# Patient Record
Sex: Male | Born: 1975 | Race: Black or African American | Hispanic: No | Marital: Married | Smoking: Never smoker
Health system: Southern US, Community
[De-identification: ages and names within clinical notes are randomized; demographics above are authoritative.]

---

## 1999-10-03 ENCOUNTER — Emergency Department (HOSPITAL_COMMUNITY): Admission: EM | Admit: 1999-10-03 | Discharge: 1999-10-03 | Payer: Self-pay | Admitting: Emergency Medicine

## 2007-12-19 ENCOUNTER — Emergency Department (HOSPITAL_COMMUNITY): Admission: EM | Admit: 2007-12-19 | Discharge: 2007-12-19 | Payer: Self-pay | Admitting: Emergency Medicine

## 2007-12-26 ENCOUNTER — Emergency Department (HOSPITAL_COMMUNITY): Admission: EM | Admit: 2007-12-26 | Discharge: 2007-12-26 | Payer: Self-pay | Admitting: Emergency Medicine
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
## 2010-04-29 ENCOUNTER — Emergency Department (HOSPITAL_COMMUNITY)
Admission: EM | Admit: 2010-04-29 | Discharge: 2010-04-29 | Payer: Self-pay | Source: Home / Self Care | Admitting: Emergency Medicine

## 2010-05-04 ENCOUNTER — Emergency Department (HOSPITAL_COMMUNITY)
Admission: EM | Admit: 2010-05-04 | Discharge: 2010-05-04 | Payer: Self-pay | Source: Home / Self Care | Admitting: Emergency Medicine

## 2010-08-05 LAB — CBC
HCT: 42.6 % (ref 39.0–52.0)
Hemoglobin: 14.5 g/dL (ref 13.0–17.0)
MCH: 30 pg (ref 26.0–34.0)
MCHC: 34 g/dL (ref 30.0–36.0)
MCV: 88 fL (ref 78.0–100.0)
Platelets: 157 10*3/uL (ref 150–400)
RBC: 4.84 MIL/uL (ref 4.22–5.81)
RDW: 13.1 % (ref 11.5–15.5)
WBC: 4.7 10*3/uL (ref 4.0–10.5)

## 2010-08-05 LAB — DIFFERENTIAL
Basophils Absolute: 0 10*3/uL (ref 0.0–0.1)
Basophils Relative: 1 % (ref 0–1)
Eosinophils Absolute: 0.1 10*3/uL (ref 0.0–0.7)
Eosinophils Relative: 2 % (ref 0–5)
Lymphocytes Relative: 41 % (ref 12–46)
Lymphs Abs: 1.9 10*3/uL (ref 0.7–4.0)
Monocytes Absolute: 0.5 10*3/uL (ref 0.1–1.0)
Monocytes Relative: 12 % (ref 3–12)
Neutro Abs: 2.1 10*3/uL (ref 1.7–7.7)
Neutrophils Relative %: 44 % (ref 43–77)

## 2010-08-05 LAB — COMPREHENSIVE METABOLIC PANEL
ALT: 18 U/L (ref 0–53)
AST: 30 U/L (ref 0–37)
Albumin: 3.6 g/dL (ref 3.5–5.2)
Alkaline Phosphatase: 74 U/L (ref 39–117)
BUN: 13 mg/dL (ref 6–23)
CO2: 29 mEq/L (ref 19–32)
Calcium: 9.1 mg/dL (ref 8.4–10.5)
Chloride: 106 mEq/L (ref 96–112)
Creatinine, Ser: 1.73 mg/dL — ABNORMAL HIGH (ref 0.4–1.5)
GFR calc Af Amer: 55 mL/min — ABNORMAL LOW (ref >60)
GFR calc non Af Amer: 46 mL/min — ABNORMAL LOW (ref >60)
Glucose, Bld: 96 mg/dL (ref 70–99)
Potassium: 4.1 mEq/L (ref 3.5–5.1)
Sodium: 141 mEq/L (ref 135–145)
Total Bilirubin: 0.5 mg/dL (ref 0.3–1.2)
Total Protein: 6.8 g/dL (ref 6.0–8.3)

## 2011-02-20 LAB — POCT URINALYSIS DIP (DEVICE)
Bilirubin Urine: NEGATIVE
Glucose, UA: NEGATIVE
Ketones, ur: NEGATIVE
Nitrite: NEGATIVE
Operator id: 239701
Protein, ur: 30 — AB
Specific Gravity, Urine: 1.025
Urobilinogen, UA: 1
pH: 6

## 2014-08-15 ENCOUNTER — Encounter (HOSPITAL_COMMUNITY): Payer: Self-pay | Admitting: Emergency Medicine

## 2014-08-15 ENCOUNTER — Emergency Department (HOSPITAL_COMMUNITY)
Admission: EM | Admit: 2014-08-15 | Discharge: 2014-08-16 | Disposition: A | Discharge status: Home / Self Care | Payer: Self-pay | Attending: Emergency Medicine | Admitting: Emergency Medicine

## 2014-08-15 ENCOUNTER — Emergency Department (HOSPITAL_COMMUNITY): Payer: Self-pay

## 2014-08-15 DIAGNOSIS — Z7982 Long term (current) use of aspirin: Secondary | ICD-10-CM | POA: Insufficient documentation

## 2014-08-15 DIAGNOSIS — R509 Fever, unspecified: Secondary | ICD-10-CM

## 2014-08-15 DIAGNOSIS — R7989 Other specified abnormal findings of blood chemistry: Secondary | ICD-10-CM | POA: Insufficient documentation

## 2014-08-15 DIAGNOSIS — B349 Viral infection, unspecified: Secondary | ICD-10-CM | POA: Insufficient documentation

## 2014-08-15 LAB — I-STAT CHEM 8, ED
BUN: 16 mg/dL (ref 6–23)
Calcium, Ion: 1.13 mmol/L (ref 1.12–1.23)
Chloride: 95 mmol/L — ABNORMAL LOW (ref 96–112)
Creatinine, Ser: 1.8 mg/dL — ABNORMAL HIGH (ref 0.50–1.35)
Glucose, Bld: 154 mg/dL — ABNORMAL HIGH (ref 70–99)
HCT: 50 % (ref 39.0–52.0)
Hemoglobin: 17 g/dL (ref 13.0–17.0)
Potassium: 4.2 mmol/L (ref 3.5–5.1)
Sodium: 136 mmol/L (ref 135–145)
TCO2: 25 mmol/L (ref 0–100)

## 2014-08-15 LAB — CBC WITH DIFFERENTIAL/PLATELET
Basophils Absolute: 0 10*3/uL (ref 0.0–0.1)
Basophils Relative: 1 % (ref 0–1)
Eosinophils Absolute: 0 10*3/uL (ref 0.0–0.7)
Eosinophils Relative: 0 % (ref 0–5)
HCT: 44.2 % (ref 39.0–52.0)
Hemoglobin: 14.8 g/dL (ref 13.0–17.0)
Lymphocytes Relative: 18 % (ref 12–46)
Lymphs Abs: 1.2 10*3/uL (ref 0.7–4.0)
MCH: 28.6 pg (ref 26.0–34.0)
MCHC: 33.5 g/dL (ref 30.0–36.0)
MCV: 85.5 fL (ref 78.0–100.0)
Monocytes Absolute: 0.8 10*3/uL (ref 0.1–1.0)
Monocytes Relative: 12 % (ref 3–12)
Neutro Abs: 4.5 10*3/uL (ref 1.7–7.7)
Neutrophils Relative %: 70 % (ref 43–77)
Platelets: 135 10*3/uL — ABNORMAL LOW (ref 150–400)
RBC: 5.17 MIL/uL (ref 4.22–5.81)
RDW: 13.4 % (ref 11.5–15.5)
WBC: 6.5 10*3/uL (ref 4.0–10.5)

## 2014-08-15 MED ORDER — PROMETHAZINE HCL 25 MG PO TABS: 25.0000 mg | ORAL_TABLET | Freq: Four times a day (QID) | ORAL | Status: DC | PRN

## 2014-08-15 MED ORDER — KETOROLAC TROMETHAMINE 30 MG/ML IJ SOLN
30.0000 mg | Freq: Once | INTRAMUSCULAR | Status: AC
  Administered 2014-08-15: 30 mg via INTRAVENOUS
  Filled 2014-08-15: qty 1

## 2014-08-15 MED ORDER — ACETAMINOPHEN 325 MG PO TABS
650.0000 mg | ORAL_TABLET | Freq: Once | ORAL | Status: AC
  Administered 2014-08-15: 650 mg via ORAL

## 2014-08-15 MED ORDER — DIPHENHYDRAMINE HCL 50 MG/ML IJ SOLN
25.0000 mg | Freq: Once | INTRAMUSCULAR | Status: AC
  Administered 2014-08-15: 25 mg via INTRAVENOUS
  Filled 2014-08-15: qty 1

## 2014-08-15 MED ORDER — SODIUM CHLORIDE 0.9 % IV BOLUS (SEPSIS)
1000.0000 mL | Freq: Once | INTRAVENOUS | Status: AC
  Administered 2014-08-15: 1000 mL via INTRAVENOUS

## 2014-08-15 MED ORDER — NAPROXEN 500 MG PO TABS: 500.0000 mg | ORAL_TABLET | Freq: Two times a day (BID) | ORAL | Status: DC

## 2014-08-15 MED ORDER — ACETAMINOPHEN 325 MG PO TABS
ORAL_TABLET | ORAL | Status: AC
  Filled 2014-08-15: qty 2

## 2014-08-15 MED ORDER — METOCLOPRAMIDE HCL 5 MG/ML IJ SOLN
10.0000 mg | Freq: Once | INTRAMUSCULAR | Status: AC
  Administered 2014-08-15: 10 mg via INTRAVENOUS
  Filled 2014-08-15: qty 2

## 2014-08-15 NOTE — ED Notes (Addendum)
Pt st's he started having body aches on Sun.  C/O pain in all of his joints.  Nausea without vomiting.  Pt also c/o headache.

## 2014-08-15 NOTE — Discharge Instructions (Signed)
Take Naprosyn as needed for fever and muscle pain. Take phenergan as needed for nausea. Refer to attached documents for more information.

## 2014-08-15 NOTE — ED Provider Notes (Signed)
CSN: [id number]     Arrival date & time 08/15/14  1755 History   First MD Initiated Contact with Patient 08/15/14 2012     Chief Complaint  Patient presents with  . Generalized Body Aches     (Consider location/radiation/quality/duration/timing/severity/associated sxs/prior Treatment) HPI Comments: Patient is a 39 year old male with no past medical history who presents with body aches for the past 3 days. Symptoms started gradually and progressively worsened since the onset. The body aches are generalized. He did not try anything at home for symptoms. He reports associated subjective fever, headache, and nausea without vomiting. No aggravating/alleviating factors. No known sick contacts.    History reviewed. No pertinent past medical history. History reviewed. No pertinent past surgical history. No family history on file. History  Substance Use Topics  . Smoking status: Never Smoker   . Smokeless tobacco: Not on file  . Alcohol Use: No    Review of Systems  Constitutional: Positive for fever. Negative for chills and fatigue.  HENT: Negative for trouble swallowing.   Eyes: Negative for visual disturbance.  Respiratory: Negative for shortness of breath.   Cardiovascular: Negative for chest pain and palpitations.  Gastrointestinal: Negative for nausea, vomiting, abdominal pain and diarrhea.  Genitourinary: Negative for dysuria and difficulty urinating.  Musculoskeletal: Positive for myalgias. Negative for arthralgias and neck pain.  Skin: Negative for color change.  Neurological: Negative for dizziness and weakness.  Psychiatric/Behavioral: Negative for dysphoric mood.      Allergies  Review of patient's allergies indicates no known allergies.  Home Medications   Prior to Admission medications   Medication Sig Start Date End Date Taking? Authorizing Provider  aspirin EC 81 MG tablet Take 81 mg by mouth daily as needed for mild pain or moderate pain.   Yes Historical  Provider, MD  Chlorphen-Pseudoephed-APAP (THERAFLU FLU/COLD PO) Take 1 packet by mouth every 4 (four) hours as needed (cold/flu like symptoms).   Yes Historical Provider, MD  ibuprofen (ADVIL,MOTRIN) 200 MG tablet Take 600 mg by mouth daily as needed for mild pain or moderate pain.   Yes Historical Provider, MD  AMOXICILLIN PO Take 1 tablet by mouth once.    Historical Provider, MD  OVER THE COUNTER MEDICATION Place 2 sprays into both nostrils once. Over the counter nasal spray    Historical Provider, MD   BP 137/82 mmHg  Pulse 78  Temp(Src) 99.5 F (37.5 C) (Oral)  Resp 16  SpO2 97% Physical Exam  Constitutional: He is oriented to person, place, and time. He appears well-developed and well-nourished. No distress.  HENT:  Head: Normocephalic and atraumatic.  Eyes: Conjunctivae and EOM are normal.  Neck: Normal range of motion.  Cardiovascular: Normal rate and regular rhythm.  Exam reveals no gallop and no friction rub.   No murmur heard. Pulmonary/Chest: Effort normal and breath sounds normal. He has no wheezes. He has no rales. He exhibits no tenderness.  Abdominal: Soft. He exhibits no distension. There is no tenderness. There is no rebound.  Musculoskeletal: Normal range of motion.  Neurological: He is alert and oriented to person, place, and time. Coordination normal.  No meningeal signs. Speech is goal-oriented. Moves limbs without ataxia.   Skin: Skin is warm and dry.  Psychiatric: He has a normal mood and affect. His behavior is normal.  Nursing note and vitals reviewed.   ED Course  Procedures (including critical care time) Labs Review Labs Reviewed  CBC WITH DIFFERENTIAL/PLATELET - Abnormal; Notable for the following:  Platelets 135 (*)    All other components within normal limits  I-STAT CHEM 8, ED - Abnormal; Notable for the following:    Chloride 95 (*)    Creatinine, Ser 1.80 (*)    Glucose, Bld 154 (*)    All other components within normal limits    Imaging  Review Dg Chest 2 View  08/15/2014   CLINICAL DATA:  Fever, nausea, body aches  EXAM: CHEST  2 VIEW  COMPARISON:  12/19/2007  FINDINGS: Cardiomediastinal silhouette is stable. No acute infiltrate or pleural effusion. No pulmonary edema. Bony thorax is unremarkable.  IMPRESSION: No active cardiopulmonary disease.   Electronically Signed   By: Natasha MeadLiviu  Pop M.D.   On: 08/15/2014 21:28     EKG Interpretation None      MDM   Final diagnoses:  Fever  Viral illness    8:44 PM Labs show mild elevated in creatinine with other values stable. Patient is febrile with remaining vitals stable.   11:58 PM Patient feeling better and will be discharged. Patient likely has a viral illness and will be discharged with naprosyn and phenergan.   Emilia BeckKaitlyn Cartier Washko, PA-C 08/15/14 2359  Arby BarretteMarcy Pfeiffer, MD 08/16/14 [phone]

## 2014-08-15 NOTE — ED Notes (Signed)
Gave pt water, per RN 

## 2016-03-12 IMAGING — CR DG CHEST 2V
2 series · 2 of 2 positions shown · non-contrast
Comparison: 12/19/2007

CLINICAL DATA: Fever, nausea, body aches

EXAM:
CHEST  2 VIEW

[w chest pa]
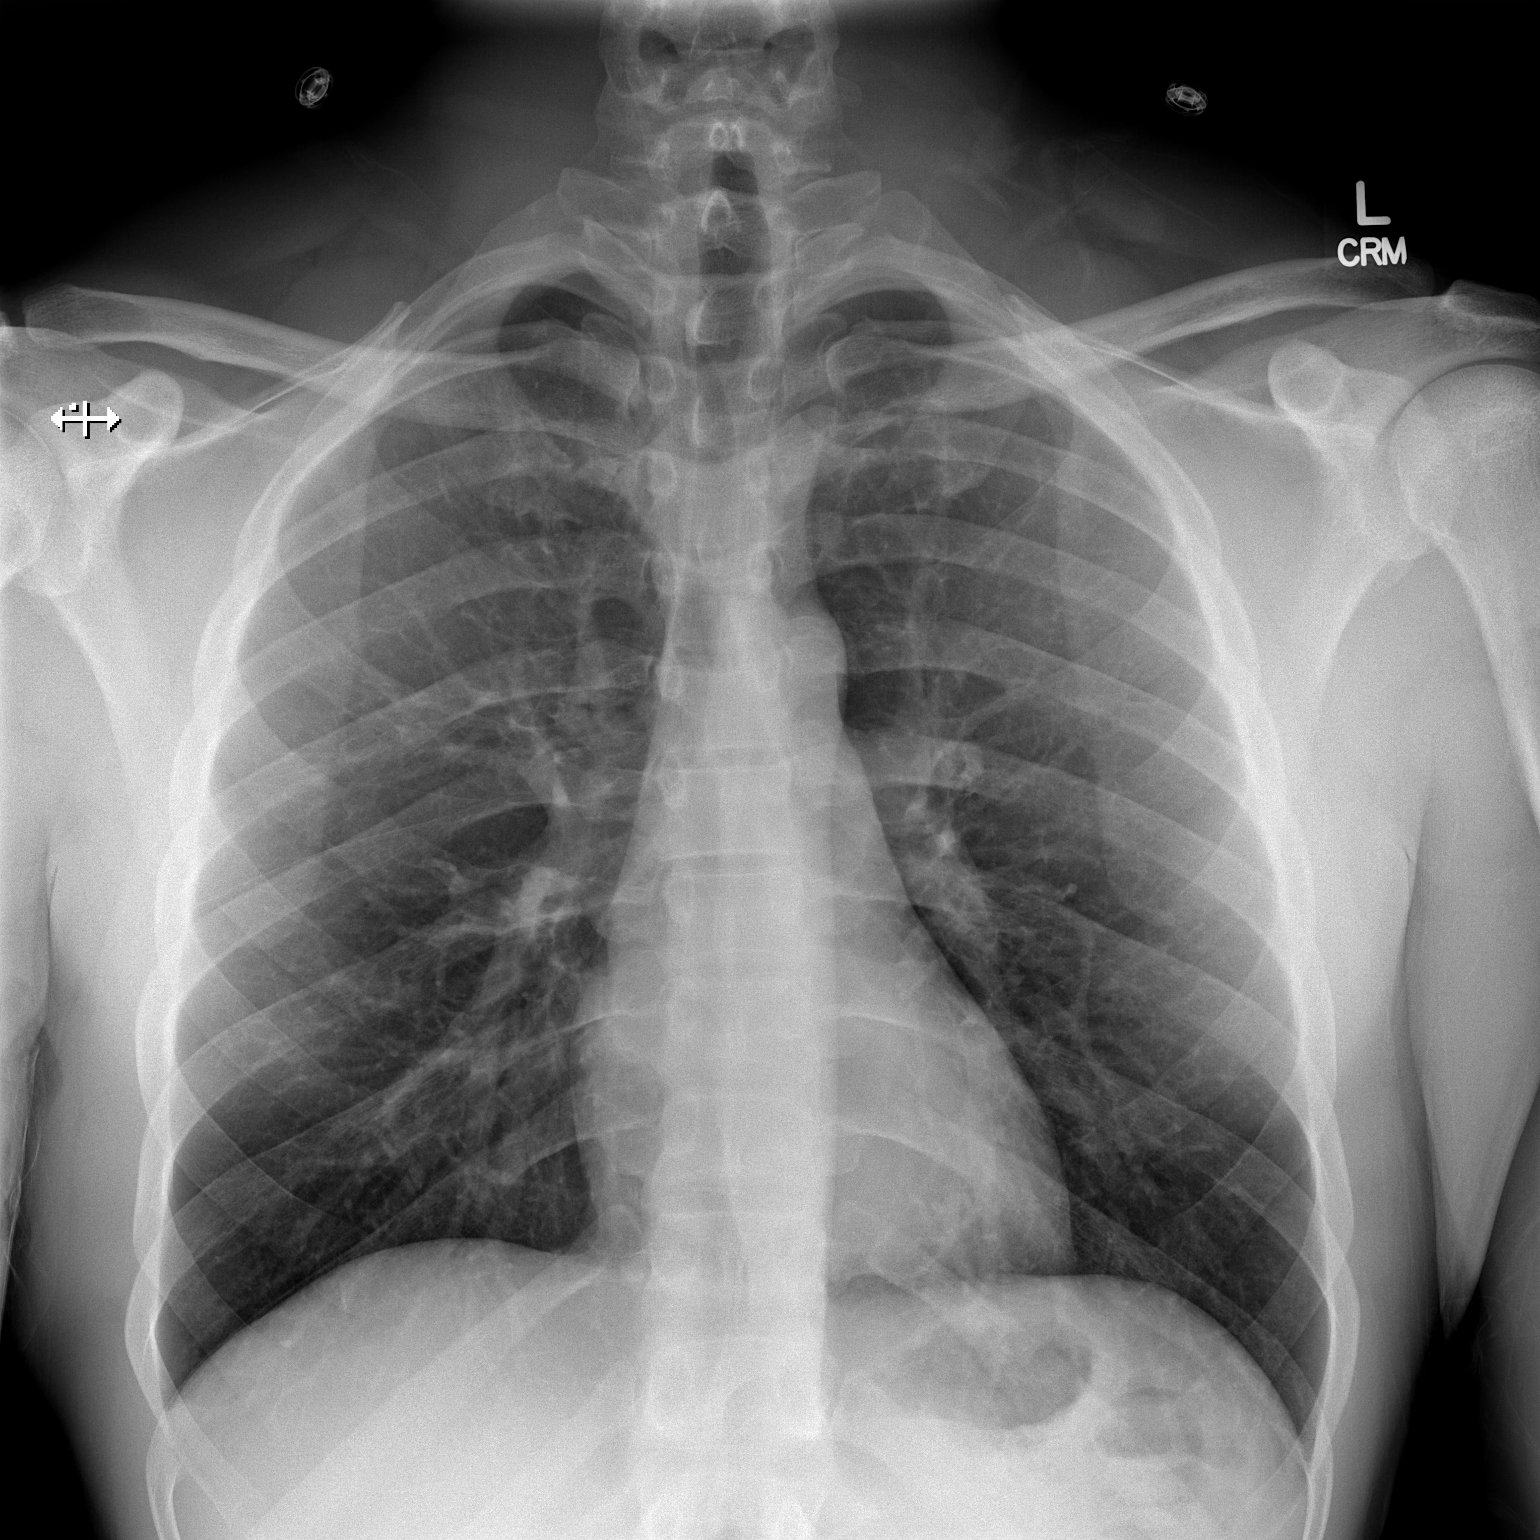

[w chest lat]
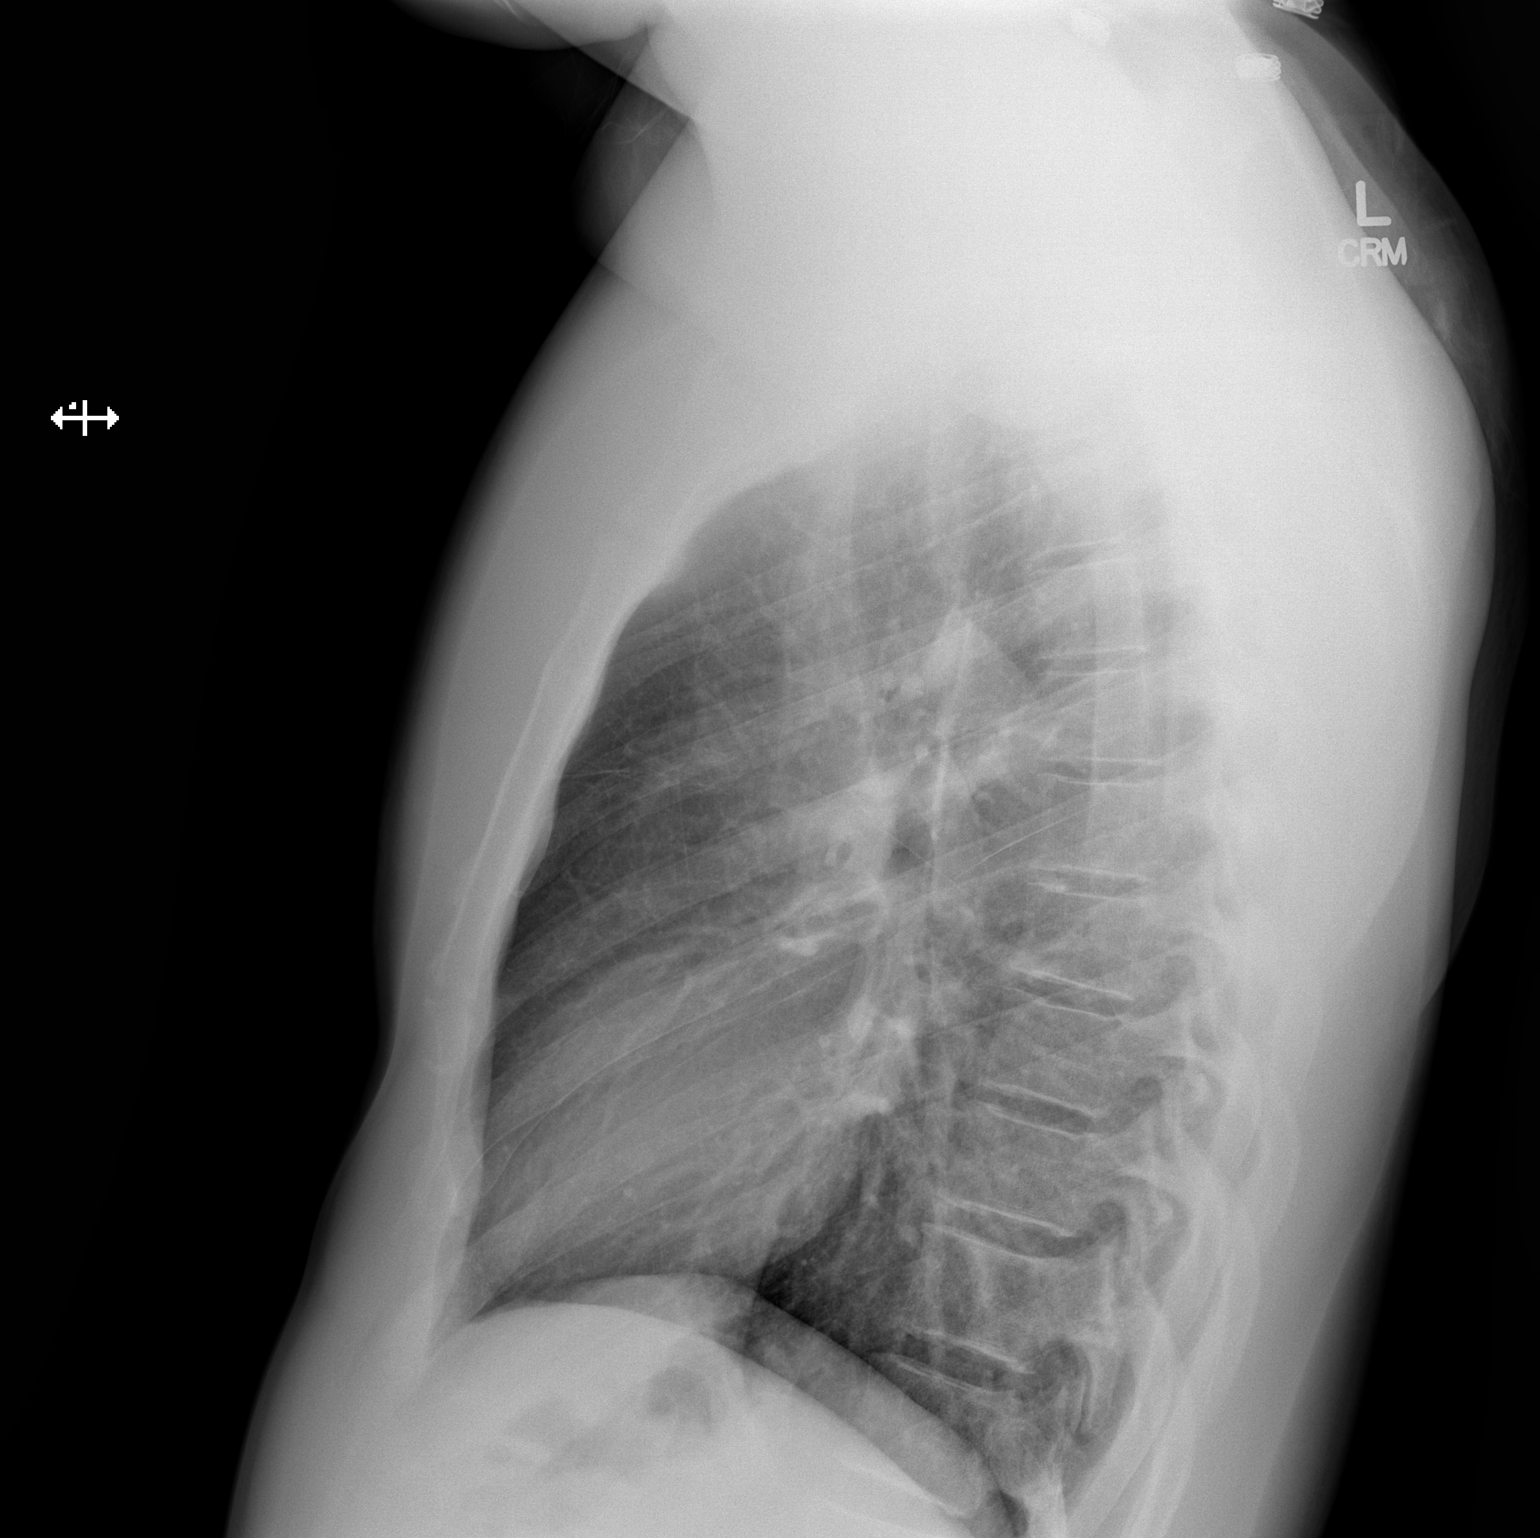

[2 of 2 positions shown; findings below may reference images not displayed]

FINDINGS: Cardiomediastinal silhouette is stable. No acute infiltrate or
pleural effusion. No pulmonary edema. Bony thorax is unremarkable.
IMPRESSION: No active cardiopulmonary disease.

## 2018-01-29 ENCOUNTER — Other Ambulatory Visit: Payer: Self-pay | Admitting: Nephrology

## 2018-01-29 DIAGNOSIS — N183 Chronic kidney disease, stage 3 unspecified: Secondary | ICD-10-CM

## 2018-03-08 ENCOUNTER — Ambulatory Visit
Admission: RE | Admit: 2018-03-08 | Discharge: 2018-03-08 | Disposition: A | Discharge status: Home / Self Care | Payer: Self-pay | Source: Ambulatory Visit | Attending: Nephrology | Admitting: Nephrology

## 2018-03-08 DIAGNOSIS — N183 Chronic kidney disease, stage 3 unspecified: Secondary | ICD-10-CM

## 2018-08-10 ENCOUNTER — Encounter (HOSPITAL_COMMUNITY): Payer: Self-pay

## 2018-08-10 ENCOUNTER — Emergency Department (HOSPITAL_COMMUNITY)
Admission: EM | Admit: 2018-08-10 | Discharge: 2018-08-10 | Disposition: A | Discharge status: Home / Self Care | Payer: BLUE CROSS/BLUE SHIELD | Attending: Emergency Medicine | Admitting: Emergency Medicine

## 2018-08-10 ENCOUNTER — Other Ambulatory Visit: Payer: Self-pay

## 2018-08-10 DIAGNOSIS — Y939 Activity, unspecified: Secondary | ICD-10-CM | POA: Insufficient documentation

## 2018-08-10 DIAGNOSIS — Z7982 Long term (current) use of aspirin: Secondary | ICD-10-CM | POA: Insufficient documentation

## 2018-08-10 DIAGNOSIS — Z23 Encounter for immunization: Secondary | ICD-10-CM | POA: Insufficient documentation

## 2018-08-10 DIAGNOSIS — Y92017 Garden or yard in single-family (private) house as the place of occurrence of the external cause: Secondary | ICD-10-CM | POA: Insufficient documentation

## 2018-08-10 DIAGNOSIS — S81812A Laceration without foreign body, left lower leg, initial encounter: Secondary | ICD-10-CM

## 2018-08-10 DIAGNOSIS — Y999 Unspecified external cause status: Secondary | ICD-10-CM | POA: Insufficient documentation

## 2018-08-10 DIAGNOSIS — W312XXA Contact with powered woodworking and forming machines, initial encounter: Secondary | ICD-10-CM | POA: Insufficient documentation

## 2018-08-10 MED ORDER — TRAMADOL HCL 50 MG PO TABS: 50.0000 mg | ORAL_TABLET | Freq: Four times a day (QID) | ORAL | 0 refills | Status: DC | PRN

## 2018-08-10 MED ORDER — LIDOCAINE-EPINEPHRINE (PF) 2 %-1:200000 IJ SOLN
20.0000 mL | Freq: Once | INTRAMUSCULAR | Status: AC
  Administered 2018-08-10: 20 mL via INTRADERMAL
  Filled 2018-08-10: qty 20

## 2018-08-10 MED ORDER — TETANUS-DIPHTH-ACELL PERTUSSIS 5-2.5-18.5 LF-MCG/0.5 IM SUSP
0.5000 mL | Freq: Once | INTRAMUSCULAR | Status: AC
  Administered 2018-08-10: 0.5 mL via INTRAMUSCULAR
  Filled 2018-08-10: qty 0.5

## 2018-08-10 NOTE — ED Provider Notes (Signed)
[facility] EMERGENCY DEPARTMENT Provider Note   CSN: [id number] Arrival date & time: 08/10/18  1711    History   Chief Complaint Chief Complaint  Patient presents with  . Laceration    HPI Matthew Tran is a 43 y.o. male who presents to the ED for laceration to the left lower extremity.  Patient was working in his yard today with a chain saw when he accidentally hit the back of his leg.  He sustained a laceration to the left lower extremity.  He washed it and immediately wrapped it and came to the emergency department he is not up-to-date on his tetanus vaccination.  He denies numbness or tingling or inability to use the foot to walk.     HPI  History reviewed. No pertinent past medical history.  There are no active problems to display for this patient.   History reviewed. No pertinent surgical history.      Home Medications    Prior to Admission medications   Medication Sig Start Date End Date Taking? Authorizing Provider  aspirin EC 81 MG tablet Take 81 mg by mouth daily as needed for mild pain or moderate pain.    [provider]  Chlorphen-Pseudoephed-APAP (THERAFLU FLU/COLD PO) Take 1 packet by mouth every 4 (four) hours as needed (cold/flu like symptoms).    [provider]  ibuprofen (ADVIL,MOTRIN) 200 MG tablet Take 600 mg by mouth daily as needed for mild pain or moderate pain.    [provider]  naproxen (NAPROSYN) 500 MG tablet Take 1 tablet (500 mg total) by mouth 2 (two) times daily with a meal. 08/15/14   Szekalski, Kaitlyn, PA-C  OVER THE COUNTER MEDICATION Place 2 sprays into both nostrils once. Over the counter nasal spray    [provider]  promethazine (PHENERGAN) 25 MG tablet Take 1 tablet (25 mg total) by mouth every 6 (six) hours as needed for nausea or vomiting. 08/15/14   Emilia Beck, PA-C    Family History History reviewed. No pertinent family history.  Social History Social  History   Tobacco Use  . Smoking status: Never Smoker  Substance Use Topics  . Alcohol use: No  . Drug use: No     Allergies   Patient has no known allergies.   Review of Systems Review of Systems Ten systems reviewed and are negative for acute change, except as noted in the HPI.   Physical Exam Updated Vital Signs BP 133/88 (BP Location: Left Arm)   Pulse 72   Temp 97.9 F (36.6 C) (Oral)   Resp 18   Ht  (1.93 m)   Wt 102.1 kg   SpO2 100%   BMI 27.39 kg/m   Physical Exam Vitals signs and nursing note reviewed.  Constitutional:      General: He is not in acute distress.    Appearance: He is well-developed. He is not diaphoretic.  HENT:     Head: Normocephalic and atraumatic.  Eyes:     General: No scleral icterus.    Conjunctiva/sclera: Conjunctivae normal.  Neck:     Musculoskeletal: Normal range of motion and neck supple.  Cardiovascular:     Rate and Rhythm: Normal rate and regular rhythm.     Heart sounds: Normal heart sounds.  Pulmonary:     Effort: Pulmonary effort is normal. No respiratory distress.     Breath sounds: Normal breath sounds.  Abdominal:     Palpations: Abdomen is soft.  Tenderness: There is no abdominal tenderness.  Musculoskeletal:     Comments: 10 cm laceration to the left calf.  Skin:    General: Skin is warm and dry.  Neurological:     Mental Status: He is alert.  Psychiatric:        Behavior: Behavior normal.     Physical Exam  Nursing note and vitals reviewed. Constitutional: He appears well-developed and well-nourished. No distress.  HENT:  Head: Normocephalic and atraumatic.  Eyes: Conjunctivae normal are normal. No scleral icterus.  Neck: Normal range of motion. Neck supple.  Cardiovascular: Normal rate, regular rhythm and normal heart sounds.   Pulmonary/Chest: Effort normal and breath sounds normal. No respiratory distress.  Abdominal: Soft. There is no tenderness.  Musculoskeletal: He exhibits no edema.   Neurological: He is alert.  Skin: Skin is warm and dry. He is not diaphoretic.  Psychiatric: His behavior is normal.    ED Treatments / Results  Labs (all labs ordered are listed, but only abnormal results are displayed) Labs Reviewed - No data to display  EKG None  Radiology No results found.  Procedures .Marland KitchenLaceration Repair Date/Time: 08/10/2018 6:46 PM Performed by: Arthor Captain, PA-C Authorized by: Arthor Captain, PA-C   Consent:    Consent obtained:  Verbal   Consent given by:  Patient   Risks discussed:  Infection, pain and need for additional repair   Alternatives discussed:  No treatment and delayed treatment Anesthesia (see MAR for exact dosages):    Anesthesia method:  Local infiltration   Local anesthetic:  Lidocaine 2% WITH epi (12) Laceration details:    Location:  Leg   Leg location:  L lower leg   Length (cm):  10   Depth (mm):  10 Repair type:    Repair type:  Intermediate Pre-procedure details:    Preparation:  Patient was prepped and draped in usual sterile fashion Exploration:    Hemostasis achieved with:  Epinephrine   Wound exploration: wound explored through full range of motion     Contaminated: yes   Treatment:    Area cleansed with:  Saline   Amount of cleaning:  Standard   Irrigation solution:  Sterile water   Irrigation method:  Pressure wash Fascia repair:    Suture size:  4-0   Suture material:  Vicryl   Suture technique:  Running   Number of sutures:  8 Skin repair:    Repair method:  Sutures   Suture size:  4-0   Suture material:  Prolene   Suture technique:  Vertical mattress and running locked   Number of sutures:  11 Approximation:    Approximation:  Close Post-procedure details:    Dressing:  Non-adherent dressing   Patient tolerance of procedure:  Tolerated well, no immediate complications   (including critical care time)  Medications Ordered in ED Medications  lidocaine-EPINEPHrine (XYLOCAINE W/EPI) 2  %-1:200000 (PF) injection 20 mL (has no administration in time range)     Initial Impression / Assessment and Plan / ED Course  I have reviewed the triage vital signs and the nursing notes.  Pertinent labs & imaging results that were available during my care of the patient were reviewed by me and considered in my medical decision making (see chart for details).        Matthew Tran is a 43 y.o. male who presents to ED for laceration of left lower extremity. Wound thoroughly cleaned in ED today. Wound explored and bottom of wound seen in  a bloodless field. Laceration repaired as dictated above. Patient counseled on home wound care. Follow up with PCP/urgent care or return to ER for suture removal in 10 days. Patient was urged to return to the Emergency Department for worsening pain, swelling, expanding erythema especially if it streaks away from the affected area, fever, or for any additional concerns. Patient verbalized understanding. All questions answered.   Final Clinical Impressions(s) / ED Diagnoses   Final diagnoses:  None    ED Discharge Orders    None       Arthor Captain, PA-C 08/10/18 2256    Derwood Kaplan, MD 08/14/18 [phone]

## 2018-08-10 NOTE — ED Notes (Signed)
Patient verbalizes understanding of discharge instructions. Opportunity for questioning and answers were provided. Armband removed by staff, pt discharged from ED ambulatory by w/ support person

## 2018-08-10 NOTE — Discharge Instructions (Addendum)
WOUND CARE ?Please have your stitches/staples removed in 10 or sooner if you have concerns. You may do this at any available urgent care or at your primary care doctor's office. ? Keep area clean and dry for 24 hours. Do not remove ?bandage, if applied. ? After 24 hours, remove bandage and wash wound ?gently with mild soap and warm water. Reapply ?a new bandage after cleaning wound, if directed. ? Continue daily cleansing with soap and water until ?stitches/staples are removed. ? Do not apply any ointments or creams to the wound ?while stitches/staples are in place, as this may cause ?delayed healing. ? Seek medical careif you experience any of the following ?signs of infection: Swelling, redness, pus drainage, ?streaking, fever >101.0 F ? Seek care if you experience excessive bleeding ?that does not stop after 15-20 minutes of constant, firm ?pressure. ? ? ?

## 2018-08-10 NOTE — ED Triage Notes (Signed)
Pt arrives POV for eval of laceration to posterior L calf w/ chainsaw, wound is oozing blood, but controlled w/ pressure, no pulsatile bleeding noted. Pt ambulatory, full ROM to L leg. Denies numbness/tingling.

## 2019-10-24 ENCOUNTER — Ambulatory Visit: Payer: Self-pay | Attending: Internal Medicine

## 2019-10-24 DIAGNOSIS — Z23 Encounter for immunization: Secondary | ICD-10-CM

## 2019-10-24 NOTE — Progress Notes (Signed)
   Covid-19 Vaccination Clinic  Name:  Matthew Tran    MRN: [id number] DOB: 1976/02/18  10/24/2019  [name] was observed post Covid-19 immunization for 15 minutes without incident. He was provided with Vaccine Information Sheet and instruction to access the V-Safe system.   [name] was instructed to call 911 with any severe reactions post vaccine: Marland Kitchen Difficulty breathing  . Swelling of face and throat  . A fast heartbeat  . A bad rash all over body  . Dizziness and weakness   Immunizations Administered    Name Date Dose VIS Date Route   Pfizer COVID-19 Vaccine 10/24/2019  4:52 PM 0.3 mL 07/19/2018 Intramuscular   Manufacturer: [employer], [employer]   Lot: [id number]   NDC: [address]-9

## 2019-11-18 ENCOUNTER — Ambulatory Visit: Payer: Self-pay | Attending: Internal Medicine

## 2019-11-18 DIAGNOSIS — Z23 Encounter for immunization: Secondary | ICD-10-CM

## 2019-11-18 NOTE — Progress Notes (Signed)
   Covid-19 Vaccination Clinic  Name:  DALTYN DEGROAT    MRN: [id number] DOB: Jun 17, 1975  11/18/2019  Mr. Sangha was observed post Covid-19 immunization for 15 minutes without incident. He was provided with Vaccine Information Sheet and instruction to access the V-Safe system.   Mr. Grieder was instructed to call 911 with any severe reactions post vaccine: Marland Kitchen Difficulty breathing  . Swelling of face and throat  . A fast heartbeat  . A bad rash all over body  . Dizziness and weakness   Immunizations Administered    Name Date Dose VIS Date Route   Pfizer COVID-19 Vaccine 11/18/2019  9:07 AM 0.3 mL 07/19/2018 Intramuscular   Manufacturer: [employer], [employer]   Lot: [id number]   NDC: [address]-1

## 2020-06-09 ENCOUNTER — Ambulatory Visit (HOSPITAL_COMMUNITY): Admit: 2020-06-09 | Disposition: A | Discharge status: Home / Self Care | Payer: Self-pay

## 2020-06-10 ENCOUNTER — Ambulatory Visit (HOSPITAL_COMMUNITY)
Admission: EM | Admit: 2020-06-10 | Discharge: 2020-06-10 | Disposition: A | Discharge status: Home / Self Care | Payer: Self-pay | Attending: Physician Assistant | Admitting: Physician Assistant

## 2020-06-10 ENCOUNTER — Encounter (HOSPITAL_COMMUNITY): Payer: Self-pay | Admitting: Emergency Medicine

## 2020-06-10 ENCOUNTER — Other Ambulatory Visit: Payer: Self-pay

## 2020-06-10 DIAGNOSIS — S39012A Strain of muscle, fascia and tendon of lower back, initial encounter: Secondary | ICD-10-CM

## 2020-06-10 DIAGNOSIS — S161XXA Strain of muscle, fascia and tendon at neck level, initial encounter: Secondary | ICD-10-CM

## 2020-06-10 MED ORDER — CYCLOBENZAPRINE HCL 7.5 MG PO TABS: 7.5000 mg | ORAL_TABLET | Freq: Three times a day (TID) | ORAL | 0 refills | Status: DC | PRN
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
## 2020-06-10 NOTE — Discharge Instructions (Addendum)
Take medications as prescribed.  Take Tylenol as needed Recommend ice/heat to affected areas.  Recommend light stretching and walking

## 2020-08-22 ENCOUNTER — Encounter: Payer: Self-pay | Admitting: Family Medicine

## 2020-08-22 ENCOUNTER — Ambulatory Visit (INDEPENDENT_AMBULATORY_CARE_PROVIDER_SITE_OTHER): Payer: 59 | Admitting: Family Medicine

## 2020-08-22 ENCOUNTER — Other Ambulatory Visit: Payer: Self-pay

## 2020-08-22 DIAGNOSIS — M545 Low back pain, unspecified: Secondary | ICD-10-CM | POA: Diagnosis not present

## 2020-08-22 DIAGNOSIS — M542 Cervicalgia: Secondary | ICD-10-CM
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
## 2020-09-07 ENCOUNTER — Ambulatory Visit
Admission: RE | Admit: 2020-09-07 | Discharge: 2020-09-07 | Disposition: A | Discharge status: Home / Self Care | Payer: 59 | Source: Ambulatory Visit | Attending: Family Medicine | Admitting: Family Medicine

## 2020-09-07 ENCOUNTER — Other Ambulatory Visit: Payer: Self-pay

## 2020-09-07 DIAGNOSIS — M545 Low back pain, unspecified: Secondary | ICD-10-CM

## 2020-09-07 DIAGNOSIS — M542 Cervicalgia: Secondary | ICD-10-CM

## 2020-09-09 ENCOUNTER — Telehealth: Payer: Self-pay | Admitting: Family Medicine

## 2020-09-09 DIAGNOSIS — M25512 Pain in left shoulder: Secondary | ICD-10-CM

## 2020-09-09 DIAGNOSIS — G8929 Other chronic pain: Secondary | ICD-10-CM

## 2020-09-09 NOTE — Telephone Encounter (Signed)
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  No nerve impingement at the time of the MRI.  Neck MRI shows a right-sided disc protrusion at C5-6 which does not compress nerves or spinal cord.  Another disc bulge on the left at C6-7 causing narrowing of the nerve opening but no nerve compression.    No clear-cut indication for surgery based on MRI results.  Could consider referral to Dr. Alvester Morin for injections if pain is severe.  Or we could try the physical therapy approach and see if it will eliminate his pain.
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
## 2020-09-11 NOTE — Telephone Encounter (Signed)
His pain pattern is more suggestive of a pinched nerve from the neck causing his shoulder pain.  But since the neck MRI did not show a definite cause of his pain, I will order a shoulder MRI.

## 2020-09-11 NOTE — Telephone Encounter (Signed)
I called and left a voice mail advising the patient of Dr. Prince Rome' message and plan for MRI of the shoulder. Asked him to call if he had any other questions/concerns.

## 2020-09-11 NOTE — Addendum Note (Signed)
Addended by: Lillia Carmel on: 09/11/2020 08:00 AM   Modules accepted: Orders

## 2020-09-22 ENCOUNTER — Other Ambulatory Visit: Payer: Self-pay

## 2020-09-22 ENCOUNTER — Ambulatory Visit
Admission: RE | Admit: 2020-09-22 | Discharge: 2020-09-22 | Disposition: A | Discharge status: Home / Self Care | Payer: 59 | Source: Ambulatory Visit | Attending: Family Medicine | Admitting: Family Medicine

## 2020-09-22 DIAGNOSIS — G8929 Other chronic pain: Secondary | ICD-10-CM

## 2020-09-22 DIAGNOSIS — M25512 Pain in left shoulder: Secondary | ICD-10-CM

## 2020-09-23 ENCOUNTER — Telehealth: Payer: Self-pay | Admitting: Family Medicine

## 2020-09-23 DIAGNOSIS — M542 Cervicalgia: Secondary | ICD-10-CM

## 2020-09-23 DIAGNOSIS — M545 Low back pain, unspecified: Secondary | ICD-10-CM

## 2020-09-23 DIAGNOSIS — G8929 Other chronic pain: Secondary | ICD-10-CM

## 2020-09-23 NOTE — Telephone Encounter (Signed)
Shoulder MRI looks good overall.  There's mild tendonitis in the infraspinatus tendon of the rotator cuff, but no tendon tears.  No need for surgery.  I'd suggest physical therapy.  Will place orders for it.

## 2020-09-24 NOTE — Telephone Encounter (Signed)
I called and gave MRI results, and he was fine with PT .

## 2020-10-01 ENCOUNTER — Other Ambulatory Visit: Payer: Self-pay

## 2020-10-01 ENCOUNTER — Ambulatory Visit (INDEPENDENT_AMBULATORY_CARE_PROVIDER_SITE_OTHER): Payer: 59 | Admitting: Rehabilitative and Restorative Service Providers"

## 2020-10-01 ENCOUNTER — Encounter: Payer: Self-pay | Admitting: Rehabilitative and Restorative Service Providers"

## 2020-10-01 DIAGNOSIS — M6281 Muscle weakness (generalized): Secondary | ICD-10-CM | POA: Diagnosis not present

## 2020-10-01 DIAGNOSIS — M545 Low back pain, unspecified: Secondary | ICD-10-CM | POA: Diagnosis not present

## 2020-10-01 DIAGNOSIS — M542 Cervicalgia: Secondary | ICD-10-CM

## 2020-10-01 DIAGNOSIS — M25512 Pain in left shoulder: Secondary | ICD-10-CM

## 2020-10-01 DIAGNOSIS — G8929 Other chronic pain: Secondary | ICD-10-CM
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
## 2020-10-02 NOTE — Patient Instructions (Signed)
Access Code: [email or web address] URL: [email or web address] Date: 10/02/2020 Prepared by: Chyrel Masson  Exercises Prone Scapular Retraction - 2 x daily - 7 x weekly - 1 sets - 10 reps - 5 hold Prone Single Arm Shoulder Y - 2 x daily - 7 x weekly - 3 sets - 10 reps Prone Shoulder Horizontal Abduction - 2 x daily - 7 x weekly - 3 sets - 10 reps Supine Lower Trunk Rotation - 2 x daily - 7 x weekly - 1 sets - 5 reps - 15 hold Supine Single Knee to Chest Stretch - 2 x daily - 7 x weekly - 1 sets - 5 reps - 15 hold Standing Lumbar Extension - 2 x daily - 7 x weekly - 2-3 sets - 5-10 reps

## 2020-10-08 ENCOUNTER — Other Ambulatory Visit: Payer: Self-pay

## 2020-10-08 ENCOUNTER — Ambulatory Visit (INDEPENDENT_AMBULATORY_CARE_PROVIDER_SITE_OTHER): Payer: 59 | Admitting: Physical Therapy

## 2020-10-08 ENCOUNTER — Encounter: Payer: Self-pay | Admitting: Physical Therapy

## 2020-10-08 DIAGNOSIS — M542 Cervicalgia: Secondary | ICD-10-CM

## 2020-10-08 DIAGNOSIS — M6281 Muscle weakness (generalized): Secondary | ICD-10-CM

## 2020-10-08 DIAGNOSIS — G8929 Other chronic pain: Secondary | ICD-10-CM

## 2020-10-08 DIAGNOSIS — M545 Low back pain, unspecified: Secondary | ICD-10-CM | POA: Diagnosis not present

## 2020-10-08 DIAGNOSIS — M25512 Pain in left shoulder: Secondary | ICD-10-CM | POA: Diagnosis not present

## 2020-10-08 NOTE — Therapy (Signed)
[facility] Physical Therapy [address] [place], [place], [address] Phone: [phone]   Fax:  [phone]  Physical Therapy Treatment  Patient Details  Name: Matthew Tran MRN: [id number] Date of Birth: 17-Jul-1975 Referring Provider (PT): Dr. Prince Rome   Encounter Date: 10/08/2020   PT End of Session - 10/08/20 0834    Visit Number 2    Number of Visits 12    Date for PT Re-Evaluation 11/27/20    Progress Note Due on Visit 10    PT Start Time 0800    PT Stop Time 0843    PT Time Calculation (min) 43 min    Activity Tolerance Patient tolerated treatment well    Behavior During Therapy [facility] for tasks assessed/performed           History reviewed. No pertinent past medical history.  History reviewed. No pertinent surgical history.  There were no vitals filed for this visit.   Subjective Assessment - 10/08/20 0810    Subjective relays 3/10 overall pain today in neck/shoulder/back. He has not yet tried the HEP.    Limitations Standing;Lifting;House hold activities;Other (comment)   work activity   Diagnostic tests MRI was performed on Lt shoulder (see chart)    Patient Stated Goals Reduce pain, get back to work    Pain Onset More than a month ago    Pain Onset More than a month ago           [facility] Adult PT Treatment/Exercise - 10/08/20 0001      Exercises   Exercises Lumbar;Shoulder      Lumbar Exercises: Stretches   Single Knee to Chest Stretch Right;Left;3 reps;30 seconds    Lower Trunk Rotation 5 reps;10 seconds    Quadruped Mid Back Stretch 3 reps;20 seconds    Other Lumbar Stretch Exercise standing lumbar extension X10 reps      Lumbar Exercises: Aerobic   Nustep L5 X 8 min UE/LE      Lumbar Exercises: Supine   Bridge 15 reps;5 seconds      Lumbar Exercises: Quadruped   Madcat/Old Horse 10 reps    Madcat/Old Horse Limitations 5 sec holds      Shoulder Exercises: Prone   Other Prone Exercises prone Y,T,I 3 sec holds 2X10 on Lt       Shoulder Exercises: Standing   External Rotation Both;20 reps    Theraband Level (Shoulder External Rotation) Level 3 (Green)    Row Both;20 reps    Theraband Level (Shoulder Row) Level 3 (Green)                    PT Short Term Goals - 10/02/20 0751      PT SHORT TERM GOAL #1   Title Patient will demonstrate independent use of home exercise program to maintain progress from in clinic treatments.    Time 3    Period Weeks    Status New    Target Date 10/23/20             PT Long Term Goals - 10/02/20 0751      PT LONG TERM GOAL #1   Title Patient will demonstrate/report pain at worst less than or equal to 2/10 to facilitate minimal limitation in daily activity secondary to pain symptoms.    Time 8    Period Weeks    Status New    Target Date 11/27/20      PT LONG TERM GOAL #2   Title Patient will  demonstrate independent use of home exercise program to facilitate ability to maintain/progress functional gains from skilled physical therapy services.    Time 8    Period Weeks    Status New    Target Date 11/27/20      PT LONG TERM GOAL #3   Title Patient will demonstrate return to work/recreational activity at previous level of function without limitations secondary due to condition    Time 8    Period Weeks    Status New    Target Date 11/27/20      PT LONG TERM GOAL #4   Title Pt. will demonstrate lumbar AROM WFL 100% s symptoms to facilitate usual daily bending, standing, walking and other work activity at [employer].    Time 8    Period Weeks    Status New    Target Date 11/27/20      PT LONG TERM GOAL #5   Title Pt. will demonstrate Lt shoulder MMT 5/5 throughout to facilitate ability to lift, carry, climb trees for work activity.    Time 8    Period Weeks    Status New    Target Date 11/27/20      Additional Long Term Goals   Additional Long Term Goals Yes      PT LONG TERM GOAL #6   Title Pt. will demonstrate FOTO outcome greater than or equal  to 67 to indicate reduced disability due to condition.    Time 8    Period Weeks    Status New    Target Date 11/27/20      PT LONG TERM GOAL #7   Title Pt. will demonstrate cervical AROM WFL s symptoms to facilitate ability to perform usual activity at PLOF s restriction at home/work.    Time 8    Period Weeks    Status New    Target Date 11/27/20                 Plan - 10/08/20 0835    Clinical Impression Statement Session focused on HEP review with cues and demo for proper technique then he showed good return demonstration and understanding. He was encouraged to consistenly perform HEP to maximize his overall progress.    Personal Factors and Comorbidities Other   multiple treatment sites   Examination-Activity Limitations Bend;Sleep;Lift;Reach Overhead;Carry    Examination-Participation Restrictions Yard Work;Occupation;Other   swimming, normal workouts   Stability/Clinical Decision Making Stable/Uncomplicated    Rehab Potential Good    PT Frequency --   1-2x/week   PT Duration 8 weeks    PT Treatment/Interventions ADLs/Self Care Home Management;Cryotherapy;Electrical Stimulation;Iontophoresis 4mg /ml Dexamethasone;Moist Heat;Traction;Balance training;Therapeutic exercise;Therapeutic activities;Functional mobility training;Stair training;Gait training;DME Instruction;Ultrasound;Neuromuscular re-education;Patient/family education;Passive range of motion;Spinal Manipulations;Joint Manipulations;Dry needling;Taping;Manual techniques    PT Next Visit Plan progress strengthening for Lt shoulder/movement coordination improvement from scapulothoracic musculature.  Assess lumbar passive mobility/myofascial restrictions.    PT Home Exercise Plan [id number]    Consulted and Agree with Plan of Care Patient           Patient will benefit from skilled therapeutic intervention in order to improve the following deficits and impairments:  Decreased endurance,Pain,Impaired UE functional  use,Increased muscle spasms,Decreased strength,Decreased activity tolerance,Decreased mobility,Impaired perceived functional ability,Improper body mechanics,Postural dysfunction,Impaired flexibility,Decreased coordination,Decreased range of motion  Visit Diagnosis: Chronic left shoulder pain  Cervicalgia  Chronic left-sided low back pain without sciatica  Muscle weakness (generalized)     Problem List There are no problems to display for this patient.  Birdie Riddle 10/08/2020, 8:36 AM  [facility] Physical Therapy [address] [place], [place], [address] Phone: [phone]   Fax:  [phone]  Name: KULE GASCOIGNE MRN: [id number] Date of Birth: August 07, 1975

## 2020-10-15 ENCOUNTER — Encounter: Payer: 59 | Admitting: Rehabilitative and Restorative Service Providers"

## 2020-10-15 ENCOUNTER — Telehealth: Payer: Self-pay | Admitting: Rehabilitative and Restorative Service Providers"

## 2020-10-15 NOTE — Telephone Encounter (Signed)
Called and spoke to patient after 15 mins no show.  Pt. Indicated work ran over.  Reminded him of next appointment time.   Chyrel Masson, PT, DPT, OCS, ATC 10/15/20  4:17 PM

## 2020-10-17 ENCOUNTER — Encounter: Payer: 59 | Admitting: Rehabilitative and Restorative Service Providers"

## 2020-10-22 ENCOUNTER — Other Ambulatory Visit: Payer: Self-pay

## 2020-10-22 ENCOUNTER — Ambulatory Visit (INDEPENDENT_AMBULATORY_CARE_PROVIDER_SITE_OTHER): Payer: 59 | Admitting: Physical Therapy

## 2020-10-22 DIAGNOSIS — M545 Low back pain, unspecified: Secondary | ICD-10-CM | POA: Diagnosis not present

## 2020-10-22 DIAGNOSIS — M6281 Muscle weakness (generalized): Secondary | ICD-10-CM | POA: Diagnosis not present

## 2020-10-22 DIAGNOSIS — M542 Cervicalgia: Secondary | ICD-10-CM

## 2020-10-22 DIAGNOSIS — M25512 Pain in left shoulder: Secondary | ICD-10-CM | POA: Diagnosis not present

## 2020-10-22 DIAGNOSIS — G8929 Other chronic pain: Secondary | ICD-10-CM

## 2020-10-22 NOTE — Therapy (Signed)
[facility] Physical Therapy [address] [place], [place], [address] Phone: [phone]   Fax:  [phone]  Physical Therapy Treatment  Patient Details  Name: Matthew Tran MRN: [id number] Date of Birth: 1975/12/08 Referring Provider (PT): Dr. Prince Rome   Encounter Date: 10/22/2020   PT End of Session - 10/22/20 1635    Visit Number 3    Number of Visits 12    Date for PT Re-Evaluation 11/27/20    Progress Note Due on Visit 10    PT Start Time 1603   Pt arrives late and requests to end session early due to work.   PT Stop Time 1630    PT Time Calculation (min) 27 min    Activity Tolerance Patient tolerated treatment well    Behavior During Therapy WFL for tasks assessed/performed           No past medical history on file.  No past surgical history on file.  There were no vitals filed for this visit.   Subjective Assessment - 10/22/20 1605    Subjective Pt states he is about at 2/10 right now. He has done HEP once. Low back is uncomfortable. Pt states the L shoulder is the most limiting factor at the moment.    Limitations Standing;Lifting;House hold activities;Other (comment)   work activity   Diagnostic tests MRI was performed on Lt shoulder (see chart)    Patient Stated Goals Reduce pain, get back to work    Currently in Pain? Yes    Pain Score 3     Pain Location Shoulder    Pain Orientation Left    Pain Descriptors / Indicators Tightness;Throbbing;Aching    Pain Onset More than a month ago    Multiple Pain Sites Yes    Pain Score 2    Pain Location Back    Pain Orientation Left;Lower    Pain Onset More than a month ago                             [facility] Adult PT Treatment/Exercise - 10/22/20 0001                                                                            Shoulder Exercises: Standing   Other Standing Exercises wall push up 15x 5s hold      Shoulder Exercises: Stretch   Other  Shoulder Stretches UT stretch 30s 3x   bilateral   Other Shoulder Stretches upper back/elephant stretch 30s 3x      Manual Therapy   Manual Therapy Joint mobilization;Soft tissue mobilization    Joint Mobilization L GHJ inf grade III    Soft tissue mobilization L UT, infra, C/S para                  PT Education - 10/22/20 1635    Education Details anatomy, exercise progression, muscle firing, HEP compliance    Person(s) Educated Patient    Methods Explanation;Demonstration;Tactile cues;Verbal cues    Comprehension Verbalized understanding;Returned demonstration;Verbal cues required;Tactile cues required            PT Short Term Goals - 10/02/20 0751  PT SHORT TERM GOAL #1   Title Patient will demonstrate independent use of home exercise program to maintain progress from in clinic treatments.    Time 3    Period Weeks    Status New    Target Date 10/23/20             PT Long Term Goals - 10/02/20 0751      PT LONG TERM GOAL #1   Title Patient will demonstrate/report pain at worst less than or equal to 2/10 to facilitate minimal limitation in daily activity secondary to pain symptoms.    Time 8    Period Weeks    Status New    Target Date 11/27/20      PT LONG TERM GOAL #2   Title Patient will demonstrate independent use of home exercise program to facilitate ability to maintain/progress functional gains from skilled physical therapy services.    Time 8    Period Weeks    Status New    Target Date 11/27/20      PT LONG TERM GOAL #3   Title Patient will demonstrate return to work/recreational activity at previous level of function without limitations secondary due to condition    Time 8    Period Weeks    Status New    Target Date 11/27/20      PT LONG TERM GOAL #4   Title Pt. will demonstrate lumbar AROM WFL 100% s symptoms to facilitate usual daily bending, standing, walking and other work activity at [employer].    Time 8    Period Weeks    Status  New    Target Date 11/27/20      PT LONG TERM GOAL #5   Title Pt. will demonstrate Lt shoulder MMT 5/5 throughout to facilitate ability to lift, carry, climb trees for work activity.    Time 8    Period Weeks    Status New    Target Date 11/27/20      Additional Long Term Goals   Additional Long Term Goals Yes      PT LONG TERM GOAL #6   Title Pt. will demonstrate FOTO outcome greater than or equal to 67 to indicate reduced disability due to condition.    Time 8    Period Weeks    Status New    Target Date 11/27/20      PT LONG TERM GOAL #7   Title Pt. will demonstrate cervical AROM WFL s symptoms to facilitate ability to perform usual activity at PLOF s restriction at home/work.    Time 8    Period Weeks    Status New    Target Date 11/27/20                 Plan - 10/22/20 1637    Clinical Impression Statement Session used to relieve L sided neck and shoulder discomfort given pt's report of decreased L shoulder use at work. Pt's L UT had increased soft tissue extensibility following STM. Pt was then instructed in gentle stretching exercise in order to try and maintain new muscle length. Neck SB and rotation ROM improved following manual therapy and exercise. Pt would benefit from continued skilled therapy in order to reach goals and maximize functional L UE and lumbopelvic strength and ROM for full return to PLOF.    Personal Factors and Comorbidities Other   multiple treatment sites   Examination-Activity Limitations Bend;Sleep;Lift;Reach Overhead;Carry    Examination-Participation Restrictions Yard Work;Occupation;Other  swimming, normal workouts   Stability/Clinical Decision Making Stable/Uncomplicated    Rehab Potential Good    PT Frequency --   1-2x/week   PT Duration 8 weeks    PT Treatment/Interventions ADLs/Self Care Home Management;Cryotherapy;Electrical Stimulation;Iontophoresis 4mg /ml Dexamethasone;Moist Heat;Traction;Balance training;Therapeutic  exercise;Therapeutic activities;Functional mobility training;Stair training;Gait training;DME Instruction;Ultrasound;Neuromuscular re-education;Patient/family education;Passive range of motion;Spinal Manipulations;Joint Manipulations;Dry needling;Taping;Manual techniques    PT Next Visit Plan progress strengthening for Lt shoulder/movement coordination improvement from scapulothoracic musculature.  Assess lumbar passive mobility/myofascial restrictions.    PT Home Exercise Plan [email or web address]    Consulted and Agree with Plan of Care Patient           Patient will benefit from skilled therapeutic intervention in order to improve the following deficits and impairments:  Decreased endurance,Pain,Impaired UE functional use,Increased muscle spasms,Decreased strength,Decreased activity tolerance,Decreased mobility,Impaired perceived functional ability,Improper body mechanics,Postural dysfunction,Impaired flexibility,Decreased coordination,Decreased range of motion  Visit Diagnosis: Chronic left shoulder pain  Cervicalgia  Chronic left-sided low back pain without sciatica  Muscle weakness (generalized)     Problem List There are no problems to display for this patient.   PT, DPT 10/22/20 4:41 PM   [facility] Physical Therapy [address] [place], [place], [place] Phone: [phone]   Fax:  [phone]  Name: EVARISTO TSUDA MRN: Ethel Rana Date of Birth: 1975-11-04

## 2020-10-25 ENCOUNTER — Encounter: Payer: 59 | Admitting: Rehabilitative and Restorative Service Providers"

## 2020-10-29 ENCOUNTER — Encounter: Payer: Self-pay | Admitting: Rehabilitative and Restorative Service Providers"

## 2020-10-31 ENCOUNTER — Encounter: Payer: 59 | Admitting: Rehabilitative and Restorative Service Providers"

## 2020-11-21 ENCOUNTER — Ambulatory Visit (INDEPENDENT_AMBULATORY_CARE_PROVIDER_SITE_OTHER): Payer: Self-pay | Admitting: Rehabilitative and Restorative Service Providers"

## 2020-11-21 ENCOUNTER — Other Ambulatory Visit: Payer: Self-pay

## 2020-11-21 ENCOUNTER — Encounter: Payer: Self-pay | Admitting: Rehabilitative and Restorative Service Providers"

## 2020-11-21 DIAGNOSIS — G8929 Other chronic pain: Secondary | ICD-10-CM

## 2020-11-21 DIAGNOSIS — M6281 Muscle weakness (generalized): Secondary | ICD-10-CM

## 2020-11-21 DIAGNOSIS — M545 Low back pain, unspecified: Secondary | ICD-10-CM

## 2020-11-21 DIAGNOSIS — M25512 Pain in left shoulder: Secondary | ICD-10-CM

## 2020-11-21 NOTE — Therapy (Addendum)
[facility] Physical Therapy [address] [place], [place], [address] Phone: [phone]   Fax:  [phone]  Physical Therapy Treatment  Patient Details  Name: Matthew Tran MRN: [id number] Date of Birth: 09-Sep-1975 Referring Provider (PT): Dr. Junius Roads  PHYSICAL THERAPY DISCHARGE SUMMARY  Visits from Start of Care: 4  Current functional level related to goals / functional outcomes: See note/RA   Remaining deficits: See note/RA   Education / Equipment: Updated HEP   Patient agrees to discharge. Patient goals were not met. Patient is being discharged due to not returning since the last visit.  Encounter Date: 11/21/2020   PT End of Session - 11/21/20 1650     Visit Number 4    Number of Visits 12    Date for PT Re-Evaluation 11/27/20    Progress Note Due on Visit 10    PT Start Time 0321    PT Stop Time 1518    PT Time Calculation (min) 41 min    Activity Tolerance Patient tolerated treatment well;No increased pain    Behavior During Therapy [facility] for tasks assessed/performed             History reviewed. No pertinent past medical history.  History reviewed. No pertinent surgical history.  There were no vitals filed for this visit.   Subjective Assessment - 11/21/20 1644     Subjective Matthew Tran notes his low back and L shoulder "get irritated" at work as a Programme researcher, broadcasting/film/video.  He admits inconsistent compliance.  He would like to have a program that is easier to get done during the day vs at home.    Limitations Standing;Lifting;House hold activities;Other (comment)   work activity   Diagnostic tests MRI was performed on Lt shoulder (see chart)    Patient Stated Goals Reduce pain, get back to work    Currently in Pain? Yes    Pain Score 3     Pain Location Other (Comment)   L shoulder and low back   Pain Descriptors / Indicators Sharp;Sore    Pain Type Chronic pain    Pain Radiating Towards NA    Pain Onset More than a month ago    Pain Frequency  Intermittent    Aggravating Factors  Work related activities (heavy physical work)    Pain Relieving Factors Rest    Effect of Pain on Daily Activities Has had to reduce his hours at work    Pain Onset More than a month ago                [facility] PT Assessment - 11/21/20 0001       ROM / Strength   AROM / PROM / Strength AROM;Strength      AROM   Overall AROM  Deficits    AROM Assessment Site Lumbar;Shoulder    Right/Left Shoulder Left;Right    Right Shoulder Flexion 160 Degrees    Right Shoulder Internal Rotation 60 Degrees    Right Shoulder External Rotation 100 Degrees    Right Shoulder Horizontal  ADduction 45 Degrees    Left Shoulder Flexion 155 Degrees    Left Shoulder Internal Rotation 60 Degrees    Left Shoulder External Rotation 100 Degrees    Left Shoulder Horizontal ADduction 45 Degrees    Lumbar Extension 15      Strength   Overall Strength Deficits    Strength Assessment Site Shoulder;Lumbar    Right/Left Shoulder Left;Right    Right Shoulder Internal Rotation --   61  pounds   Right Shoulder External Rotation --   44 pounds   Left Shoulder Internal Rotation --   61 pounds   Left Shoulder External Rotation --   33 pounds   Lumbar Extension --   15 seconds spine strength test                          [facility] Adult PT Treatment/Exercise - 11/21/20 0001       Exercises   Exercises Lumbar;Shoulder      Lumbar Exercises: Standing   Other Standing Lumbar Exercises Standing trunk extension AROM 10X 3 seconds hips forward    Other Standing Lumbar Exercises Shoulder blade pinches 10X 5 seconds      Lumbar Exercises: Prone   Straight Leg Raise 10 reps;5 seconds;Limitations    Straight Leg Raises Limitations Prone alternating hip extensions      Shoulder Exercises: Standing   External Rotation Strengthening;Left;10 reps;Theraband;Limitations    Theraband Level (Shoulder External Rotation) Level 4 (Blue)    External Rotation Limitations 3  seconds with slow eccentrics                    PT Education - 11/21/20 1648     Education Details Reviewed RA findings, discussed spine and shoulder anatomy, prioritized 4 exercises.    Person(s) Educated Patient    Methods Explanation;Demonstration;Verbal cues;Handout    Comprehension Verbal cues required;Need further instruction;Returned demonstration;Verbalized understanding;Tactile cues required              PT Short Term Goals - 11/21/20 1648       PT SHORT TERM GOAL #1   Title Patient will demonstrate independent use of home exercise program to maintain progress from in clinic treatments.    Time 3    Period Weeks    Status On-going    Target Date 12/19/20               PT Long Term Goals - 11/21/20 1649       PT LONG TERM GOAL #1   Title Patient will demonstrate/report pain at worst less than or equal to 2/10 to facilitate minimal limitation in daily activity secondary to pain symptoms.    Time 8    Period Weeks    Status On-going    Target Date 12/19/20      PT LONG TERM GOAL #2   Title Patient will demonstrate independent use of home exercise program to facilitate ability to maintain/progress functional gains from skilled physical therapy services.    Time 8    Period Weeks    Status On-going    Target Date 12/19/20      PT LONG TERM GOAL #3   Title Patient will demonstrate return to work/recreational activity at previous level of function without limitations secondary due to condition    Time 8    Period Weeks    Status On-going    Target Date 12/19/20      PT LONG TERM GOAL #4   Title Pt. will demonstrate lumbar AROM WFL 100% s symptoms to facilitate usual daily bending, standing, walking and other work activity at [employer].    Time 8    Period Weeks    Status On-going    Target Date 12/19/20      PT LONG TERM GOAL #5   Title Pt. will demonstrate Lt shoulder MMT 5/5 throughout to facilitate ability to lift, carry, climb trees for  work  activity.    Time 8    Period Weeks    Status On-going    Target Date 12/19/20      PT LONG TERM GOAL #6   Title Pt. will demonstrate FOTO outcome greater than or equal to 67 to indicate reduced disability due to condition.    Time 8    Period Weeks    Status On-going    Target Date 12/19/20      PT LONG TERM GOAL #7   Title Pt. will demonstrate cervical AROM WFL s symptoms to facilitate ability to perform usual activity at PLOF s restriction at home/work.    Time 8    Period Weeks    Status Achieved                   Plan - 11/21/20 1651     Clinical Impression Statement RA today shows Matthew Tran has good flexibility other than lumbar extension AROM.  Back strength (prone Superman) and posterior RTC strength are both shy of normals (back strength 15 seconds, 60 seconds goal and ER:IR strength ratio ~ 1:2 , should be 2:3).  I updated and simplified his HEP based on today's findings and on Matthew Tran's request to have a program he can do at work and with less time committment at home.  His prognosis is good with consistent HEP compliance (which has been lacking thus far).    Personal Factors and Comorbidities Other   multiple treatment sites   Examination-Activity Limitations Bend;Sleep;Lift;Reach Overhead;Carry    Examination-Participation Restrictions Yard Work;Occupation;Other   swimming, normal workouts   Stability/Clinical Decision Making Stable/Uncomplicated    Rehab Potential Good    PT Frequency Other (comment)   See 1X in 3 weeks   PT Duration 3 weeks    PT Treatment/Interventions ADLs/Self Care Home Management;Cryotherapy;Electrical Stimulation;Iontophoresis 79m/ml Dexamethasone;Moist Heat;Traction;Balance training;Therapeutic exercise;Therapeutic activities;Functional mobility training;Stair training;Gait training;DME Instruction;Ultrasound;Neuromuscular re-education;Patient/family education;Passive range of motion;Spinal Manipulations;Joint Manipulations;Dry  needling;Taping;Manual techniques    PT Next Visit Plan Assess progress of L shoulder ER and low back strength    PT Home Exercise Plan [email or web address]    RSWNIOEVOand Agree with Plan of Care Patient             Patient will benefit from skilled therapeutic intervention in order to improve the following deficits and impairments:  Decreased endurance, Pain, Impaired UE functional use, Increased muscle spasms, Decreased strength, Decreased activity tolerance, Decreased mobility, Impaired perceived functional ability, Improper body mechanics, Postural dysfunction, Impaired flexibility, Decreased coordination, Decreased range of motion  Visit Diagnosis: Chronic left shoulder pain  Muscle weakness (generalized)  Chronic bilateral low back pain without sciatica     Problem List There are no problems to display for this patient.   RFarley LyPT, MPT 11/21/2020, 4:56 PM  [facility] [address] [address]Phone: [phone]  Fax:  [phone] Name: Matthew JANSSENMRN: [id number] of Birth: [birth year]

## 2020-11-21 NOTE — Patient Instructions (Signed)
Access Code: [email or web address] URL: [email or web address] Date: 11/21/2020 Prepared by: Pauletta Browns  Exercises Prone Scapular Retraction - 2 x daily - 7 x weekly - 1 sets - 10 reps - 5 hold Prone Single Arm Shoulder Y (Mirrored) - 2 x daily - 7 x weekly - 3 sets - 10 reps Prone Shoulder Horizontal Abduction (Mirrored) - 2 x daily - 7 x weekly - 3 sets - 10 reps Supine Lower Trunk Rotation - 2 x daily - 7 x weekly - 1 sets - 5 reps - 15 hold Supine Single Knee to Chest Stretch - 2 x daily - 7 x weekly - 1 sets - 5 reps - 15 hold Standing Lumbar Extension - 2 x daily - 7 x weekly - 2-3 sets - 5-10 reps Standing Lumbar Extension at Wall - Forearms - 5 x daily - 7 x weekly - 1 sets - 5 reps - 3 seconds hold Standing Scapular Retraction - 5 x daily - 7 x weekly - 1 sets - 5 reps - 5 second hold Prone Hip Extension - 1 x daily - 7 x weekly - 2-3 sets - 10 reps - 3 seconds hold Shoulder External Rotation with Anchored Resistance with Towel Under Elbow - 1 x daily - 3 x weekly - 2-3 sets - 10 reps - 3 hold

## 2020-11-27 ENCOUNTER — Encounter: Payer: Self-pay | Admitting: Physical Therapy

## 2020-12-18 ENCOUNTER — Encounter: Payer: Self-pay | Admitting: Physical Therapy

## 2021-01-02 ENCOUNTER — Encounter: Payer: Self-pay | Admitting: Physical Therapy

## 2021-01-23 ENCOUNTER — Other Ambulatory Visit: Payer: Self-pay | Admitting: Family Medicine

## 2022-06-22 ENCOUNTER — Telehealth: Payer: Self-pay | Admitting: Sports Medicine
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
## 2022-06-25 ENCOUNTER — Ambulatory Visit (INDEPENDENT_AMBULATORY_CARE_PROVIDER_SITE_OTHER): Payer: BC Managed Care – PPO | Admitting: Sports Medicine

## 2022-06-25 ENCOUNTER — Telehealth: Payer: Self-pay | Admitting: Sports Medicine

## 2022-06-25 DIAGNOSIS — M48061 Spinal stenosis, lumbar region without neurogenic claudication: Secondary | ICD-10-CM | POA: Diagnosis not present

## 2022-06-25 DIAGNOSIS — M4302 Spondylolysis, cervical region: Secondary | ICD-10-CM | POA: Diagnosis not present

## 2022-06-25 DIAGNOSIS — M4316 Spondylolisthesis, lumbar region: Secondary | ICD-10-CM
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
## 2022-06-25 NOTE — Telephone Encounter (Signed)
06/25/22 ov note placed at front desk for patient to pick up.
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
## 2022-06-25 NOTE — Telephone Encounter (Signed)
Patient seen today, needs copy of today's progress note. Wants to pick up in the morning.

## 2022-10-22 ENCOUNTER — Encounter (HOSPITAL_BASED_OUTPATIENT_CLINIC_OR_DEPARTMENT_OTHER): Payer: Self-pay

## 2022-10-22 ENCOUNTER — Other Ambulatory Visit: Payer: Self-pay

## 2022-10-22 ENCOUNTER — Emergency Department (HOSPITAL_BASED_OUTPATIENT_CLINIC_OR_DEPARTMENT_OTHER): Payer: BC Managed Care – PPO

## 2022-10-22 ENCOUNTER — Emergency Department (HOSPITAL_BASED_OUTPATIENT_CLINIC_OR_DEPARTMENT_OTHER)
Admission: EM | Admit: 2022-10-22 | Discharge: 2022-10-22 | Disposition: A | Discharge status: Home / Self Care | Payer: BC Managed Care – PPO | Attending: Emergency Medicine | Admitting: Emergency Medicine

## 2022-10-22 DIAGNOSIS — S0990XA Unspecified injury of head, initial encounter: Secondary | ICD-10-CM | POA: Diagnosis not present

## 2022-10-22 DIAGNOSIS — W228XXA Striking against or struck by other objects, initial encounter: Secondary | ICD-10-CM | POA: Diagnosis not present

## 2022-10-22 NOTE — ED Notes (Signed)
Pt returned from CT at this time.  

## 2022-10-22 NOTE — ED Provider Notes (Signed)
[place] EMERGENCY DEPARTMENT AT [facility] Provider Note   CSN: [id number] Arrival date & time: 10/22/22  2050     History  Chief Complaint  Patient presents with   Head Injury    Matthew Tran is a 47 y.o. male.  47 y.o. M with a chief complaints of being struck on the head by a tree branch.  The patient was doing some tree work and unfortunately had a tree branch hit him in the back of the head.  This happened about 8 hours ago.  Since then he is felt a little bit off.  Tells me that he is very sleepy and he has been a bit confused at times.  Nauseated but no vomiting.  He has had trouble concentrating.  He was able to play in a softball game and hit a bunch of ground balls which he was upset about.  He is here because he feels like he needs CT imaging.   Head Injury      Home Medications Prior to Admission medications   Medication Sig Start Date End Date Taking? Authorizing Provider  acetaminophen (TYLENOL) 500 MG tablet Take 500 mg by mouth every 6 (six) hours as needed.    [provider]      Allergies    Patient has no known allergies.    Review of Systems   Review of Systems  Physical Exam Updated Vital Signs BP 129/88 (BP Location: Right Arm)   Pulse 90   Temp 98.2 F (36.8 C) (Oral)   Resp 18   Ht 6\' 5"  (1.956 m)   Wt 108.9 kg   SpO2 99%   BMI 28.46 kg/m  Physical Exam Vitals and nursing note reviewed.  Constitutional:      Appearance: He is well-developed.  HENT:     Head: Normocephalic and atraumatic.  Eyes:     Pupils: Pupils are equal, round, and reactive to light.  Neck:     Vascular: No JVD.  Cardiovascular:     Rate and Rhythm: Normal rate and regular rhythm.     Heart sounds: No murmur heard.    No friction rub. No gallop.  Pulmonary:     Effort: No respiratory distress.     Breath sounds: No wheezing.  Abdominal:     General: There is no distension.     Tenderness: There is no abdominal tenderness. There is  no guarding or rebound.  Musculoskeletal:        General: Normal range of motion.     Cervical back: Normal range of motion and neck supple.  Skin:    Coloration: Skin is not pale.     Findings: No rash.  Neurological:     Mental Status: He is alert and oriented to person, place, and time.     GCS: GCS eye subscore is 4. GCS verbal subscore is 5. GCS motor subscore is 6.     Cranial Nerves: Cranial nerves 2-12 are intact.     Sensory: Sensation is intact.     Motor: Motor function is intact.     Coordination: Coordination is intact.  Psychiatric:        Behavior: Behavior normal.     ED Results / Procedures / Treatments   Labs (all labs ordered are listed, but only abnormal results are displayed) Labs Reviewed - No data to display  EKG None  Radiology CT Head Wo Contrast  Result Date: 10/22/2022 CLINICAL DATA:  Endorses having a 30lb  Tree Limb fall onto his head today from approximately 3-5 feet distance. This Occurred at 1300. No LOC. No Anticoagulants. Some Dizziness and nausea. No Helmet but had a [employer] on. EXAM: CT HEAD WITHOUT CONTRAST TECHNIQUE: Contiguous axial images were obtained from the base of the skull through the vertex without intravenous contrast. RADIATION DOSE REDUCTION: This exam was performed according to the departmental dose-optimization program which includes automated exposure control, adjustment of the mA and/or kV according to patient size and/or use of iterative reconstruction technique. COMPARISON:  CT head 04/30/2011 FINDINGS: Brain: No evidence of large-territorial acute infarction. No parenchymal hemorrhage. No mass lesion. No extra-axial collection. No mass effect or midline shift. No hydrocephalus. Basilar cisterns are patent. Vascular: No hyperdense vessel. Skull: No acute fracture or focal lesion. Sinuses/Orbits: Paranasal sinuses and mastoid air cells are clear. The orbits are unremarkable. Other: None. IMPRESSION: No acute intracranial  abnormality. Electronically Signed   By: Tish Frederickson M.D.   On: 10/22/2022 21:43    Procedures Procedures    Medications Ordered in ED Medications - No data to display  ED Course/ Medical Decision Making/ A&P                             Medical Decision Making Amount and/or Complexity of Data Reviewed Radiology: ordered.   47 yo M chief complaints of a closed head injury.  The patient was doing some tree work and had a tree limb striking the back of the head.  Since then he has not felt well.  Tells me that he has had multiple concussions in the past.  He was notably able to play softball and was able to connect with multiple pitches however did not hit it as well as he felt he should and felt a bit sluggish.  I discussed risk and benefits of CT imaging and he is electing for CT at this time.  CT of the head independently interpreted by me without intracranial hemorrhage.  Will discharge home.  PCP follow-up.  9:48 PM:  I have discussed the diagnosis/risks/treatment options with the patient.  Evaluation and diagnostic testing in the emergency department does not suggest an emergent condition requiring admission or immediate intervention beyond what has been performed at this time.  They will follow up with PCP. We also discussed returning to the ED immediately if new or worsening sx occur. We discussed the sx which are most concerning (e.g., sudden worsening pain, fever, inability to tolerate by mouth, confusion, vomiting) that necessitate immediate return. Medications administered to the patient during their visit and any new prescriptions provided to the patient are listed below.  Medications given during this visit Medications - No data to display   The patient appears reasonably screen and/or stabilized for discharge and I doubt any other medical condition or other [facility] requiring further screening, evaluation, or treatment in the ED at this time prior to discharge.           Final Clinical Impression(s) / ED Diagnoses Final diagnoses:  Closed head injury, initial encounter    Rx / DC Orders ED Discharge Orders     None         Melene Plan, DO 10/22/22 2148

## 2022-10-22 NOTE — ED Triage Notes (Addendum)
Patient here POV from Home.  Endorses having a 30lb Tree Limb fall onto his head today from approximately 3-5 feet distance. This Occurred at 1300. No LOC. No Anticoagulants. Some Dizziness and nausea. No Helmet but had a [employer] on.   NAD Noted during triage. A&Ox4. GCS 15. Ambulatory.

## 2022-10-22 NOTE — Discharge Instructions (Signed)
Your CT scan did not show any obvious intracranial hemorrhage.  Most likely you have a concussion.  Please follow-up your family doctor in the office.  Typically these resolve over the course of the week if you are still having ongoing symptoms and they may refer you to a specialist.   Take 4 over the counter ibuprofen tablets 3 times a day or 2 over-the-counter naproxen tablets twice a day for pain. Also take tylenol 1000mg (2 extra strength) four times a day.

## 2022-10-22 NOTE — ED Notes (Signed)
Reviewed AVS with patient, patient expressed understanding of directions, denies further questions at this time. 

## 2023-10-11 ENCOUNTER — Ambulatory Visit (INDEPENDENT_AMBULATORY_CARE_PROVIDER_SITE_OTHER): Payer: Self-pay

## 2023-10-11 ENCOUNTER — Ambulatory Visit (HOSPITAL_COMMUNITY)
Admission: RE | Admit: 2023-10-11 | Discharge: 2023-10-11 | Disposition: A | Discharge status: Home / Self Care | Payer: Self-pay | Source: Ambulatory Visit | Attending: Emergency Medicine | Admitting: Emergency Medicine

## 2023-10-11 ENCOUNTER — Encounter (HOSPITAL_COMMUNITY): Payer: Self-pay

## 2023-10-11 VITALS — BP 153/83 | HR 66 | Temp 98.6°F | Resp 18

## 2023-10-11 DIAGNOSIS — M25561 Pain in right knee: Secondary | ICD-10-CM

## 2023-10-11 NOTE — ED Triage Notes (Signed)
 [place] the [place]. Took tylenol  with relief.

## 2023-10-11 NOTE — Discharge Instructions (Addendum)
 I did not see any obvious bony abnormalities on your knee x-ray.  Official radiology overread will be back within the next few hours and you will be contacted if the radiologist read is different from mine.  Wear the knee sleeve to help provide compression.  Rest, ice and elevate the knee.  For pain you can take 600-800 mg of ibuprofen every 8 hours as needed.  If the pain persists over the next week without improvement follow-up with an orthopedic for further advanced evaluation.
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
